# Patient Record
Sex: Female | Born: 1990 | Race: White | Hispanic: No | Marital: Single | Smoking: Never smoker
Health system: Southern US, Community
[De-identification: ages and names within clinical notes are randomized; demographics above are authoritative.]

## PROBLEM LIST (undated history)

## (undated) HISTORY — PX: EXTERNAL EAR SURGERY: SHX627

---
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
## 2012-07-19 ENCOUNTER — Emergency Department (INDEPENDENT_AMBULATORY_CARE_PROVIDER_SITE_OTHER)
Admission: EM | Admit: 2012-07-19 | Discharge: 2012-07-19 | Disposition: A | Discharge status: Home / Self Care | Payer: Managed Care, Other (non HMO) | Source: Home / Self Care | Attending: Family Medicine | Admitting: Family Medicine

## 2012-07-19 ENCOUNTER — Ambulatory Visit (INDEPENDENT_AMBULATORY_CARE_PROVIDER_SITE_OTHER): Payer: Managed Care, Other (non HMO) | Admitting: Sports Medicine

## 2012-07-19 ENCOUNTER — Encounter: Payer: Self-pay | Admitting: *Deleted

## 2012-07-19 DIAGNOSIS — M25529 Pain in unspecified elbow: Secondary | ICD-10-CM

## 2012-07-19 DIAGNOSIS — M25522 Pain in left elbow: Secondary | ICD-10-CM

## 2012-07-19 DIAGNOSIS — G5600 Carpal tunnel syndrome, unspecified upper limb: Secondary | ICD-10-CM

## 2012-07-19 DIAGNOSIS — G5611 Other lesions of median nerve, right upper limb: Secondary | ICD-10-CM

## 2012-07-19 DIAGNOSIS — G5610 Other lesions of median nerve, unspecified upper limb: Secondary | ICD-10-CM | POA: Insufficient documentation

## 2012-07-19 MED ORDER — MELOXICAM 15 MG PO TABS: ORAL_TABLET | ORAL | Status: DC

## 2012-07-19 NOTE — Assessment & Plan Note (Signed)
We will start conservatively. I strapped the elbow. Home exercises. Mobic. Icing. Return in to 3 weeks.

## 2012-07-19 NOTE — Progress Notes (Signed)
  Subjective:    I'm seeing this patient as a consultation for:  Dr. Doree Albee  CC: Elbow pain  HPI: This is pleasant 22 year-old woman who presents with right elbow pain for 3 days. She describes the pain as dull that reaches 6/10 in severity. She can identify no inciting trauma. Her job does involve repetitive motions at the elbow. The pain does not radiate, and she denies any numbness or tingling. She has taken ibuprofen with minimal relief. She does report a recent sore throat which has resolved.  Family history significant for autoimmune disease: thyroid disease in mother and celiac disease in twin.  Past medical history, Surgical history, Family history not pertinant except as noted below, Social history, Allergies, and medications have been entered into the medical record, reviewed, and no changes needed.   Review of Systems: No headache, visual changes, nausea, vomiting, diarrhea, constipation, dizziness, abdominal pain, skin rash, fevers, chills, night sweats, weight loss, swollen lymph nodes, body aches, joint swelling, muscle aches, chest pain, shortness of breath, mood changes, visual or auditory hallucinations.   Objective:   General: Well Developed, well nourished, and in no acute distress.  Neuro/Psych: Alert and oriented x3, extra-ocular muscles intact, able to move all 4 extremities, sensation grossly intact. Skin: Warm and dry, no rashes noted.  Respiratory: Not using accessory muscles, speaking in full sentences, trachea midline.  Cardiovascular: Pulses palpable, no extremity edema. Abdomen: Does not appear distended. Right Elbow: Unremarkable to inspection. Range of motion full pronation, supination, flexion, extension. Strength is full to all of the above directions. Pain was reproducible with resisted pronation. Stable to varus, valgus stress. Palpation reveals tender area in the anterior cubital fossa corresponding to the pronator teres proximally.  Impression  and Recommendations:   This case required medical decision making of moderate complexity.

## 2012-07-19 NOTE — ED Provider Notes (Signed)
History     CSN: [id number]  Arrival date & time 07/19/12  1256   First MD Initiated Contact with Patient 07/19/12 1300      Chief Complaint  Patient presents with  . Mass    right inner arm   HPI  R elbow pain x 2-3 days.  Initially felt like she had a lump along inner elbow.  Pt works doing a lot of repetitive movements at work.  Feels that pain may be attributed to this.  No distal numbness or paresthesias.  Grip strength also decreased 2/2 pain.    History reviewed. No pertinent past medical history.  Past Surgical History  Procedure Laterality Date  . External ear surgery      Family History  Problem Relation Age of Onset  . Hypertension Mother     History  Substance Use Topics  . Smoking status: Never Smoker   . Smokeless tobacco: Never Used  . Alcohol Use: No    OB History   Grav Para Term Preterm Abortions TAB SAB Ect Mult Living                  Review of Systems  All other systems reviewed and are negative.    Allergies  Cefuroxime axetil  Home Medications   Current Outpatient Rx  Name  Route  Sig  Dispense  Refill  . meloxicam (MOBIC) 15 MG tablet      One tab PO qAM with breakfast for 2 weeks, then daily prn pain.   30 tablet   3     BP 119/76  Pulse 77  Temp(Src) 98 F (36.7 C) (Oral)  Resp 14  Ht 5\' 4"  (1.626 m)  Wt 155 lb (70.308 kg)  BMI 26.59 kg/m2  SpO2 100%  LMP 07/12/2012  Physical Exam  Constitutional: She appears well-developed and well-nourished.  HENT:  Head: Normocephalic.  Eyes: Conjunctivae are normal. Pupils are equal, round, and reactive to light.  Neck: Normal range of motion.  Cardiovascular: Normal rate and regular rhythm.   Pulmonary/Chest: Effort normal.  Abdominal: Soft.  Musculoskeletal:       Arms: Neurological: She is alert.  Skin: Skin is warm.    ED Course  Procedures (including critical care time)  Labs Reviewed - No data to display No results found.   1. Elbow pain, left        MDM  Ddx include medial epicondylitis, bursitis, tendinitis.  Will formally consult sports medicine as they are available.   Treatment plan and follow up per sports medicine recs.     The patient and/or caregiver has been counseled thoroughly with regard to treatment plan and/or medications prescribed including dosage, schedule, interactions, rationale for use, and possible side effects and they verbalize understanding. Diagnoses and expected course of recovery discussed and will return if not improved as expected or if the condition worsens. Patient and/or caregiver verbalized understanding.             Doree Albee, MD 08/02/12 [phone]

## 2012-07-19 NOTE — ED Notes (Signed)
Parveen c/o right inner arm lump x 3 days. It became tender 2 days ago. No known cause/injury. Denies pain or tingling radiating down her arm.

## 2013-03-20 ENCOUNTER — Emergency Department (INDEPENDENT_AMBULATORY_CARE_PROVIDER_SITE_OTHER)
Admission: EM | Admit: 2013-03-20 | Discharge: 2013-03-20 | Disposition: A | Discharge status: Home / Self Care | Payer: Managed Care, Other (non HMO) | Source: Home / Self Care

## 2013-03-20 ENCOUNTER — Encounter: Payer: Self-pay | Admitting: Emergency Medicine

## 2013-03-20 DIAGNOSIS — Z23 Encounter for immunization: Secondary | ICD-10-CM

## 2013-03-20 DIAGNOSIS — Z0000 Encounter for general adult medical examination without abnormal findings: Secondary | ICD-10-CM

## 2013-03-20 DIAGNOSIS — Z203 Contact with and (suspected) exposure to rabies: Secondary | ICD-10-CM

## 2013-03-20 MED ORDER — RABIES VACCINE, PCEC IM SUSR
1.0000 mL | Freq: Once | INTRAMUSCULAR | Status: AC
  Administered 2013-03-20: 1 mL via INTRAMUSCULAR

## 2013-03-20 NOTE — ED Notes (Signed)
Nandini is here for prophylactic rabies vaccines for school. 1st shot in 3 shot series.

## 2013-03-27 ENCOUNTER — Emergency Department (INDEPENDENT_AMBULATORY_CARE_PROVIDER_SITE_OTHER)
Admission: EM | Admit: 2013-03-27 | Discharge: 2013-03-27 | Disposition: A | Discharge status: Home / Self Care | Payer: Managed Care, Other (non HMO) | Source: Home / Self Care

## 2013-03-27 ENCOUNTER — Encounter: Payer: Self-pay | Admitting: Emergency Medicine

## 2013-03-27 DIAGNOSIS — Z203 Contact with and (suspected) exposure to rabies: Secondary | ICD-10-CM

## 2013-03-27 DIAGNOSIS — Z23 Encounter for immunization: Secondary | ICD-10-CM

## 2013-03-27 MED ORDER — RABIES VACCINE, PCEC IM SUSR
1.0000 mL | Freq: Once | INTRAMUSCULAR | Status: AC
  Administered 2013-03-27: 1 mL via INTRAMUSCULAR

## 2013-03-27 NOTE — ED Notes (Signed)
Desires number 2 of 3 injection Rabies series. Had some local redness and soreness at site of first injection last week.

## 2013-04-10 ENCOUNTER — Emergency Department
Admission: EM | Admit: 2013-04-10 | Discharge: 2013-04-10 | Disposition: A | Discharge status: Home / Self Care | Payer: Managed Care, Other (non HMO) | Source: Home / Self Care

## 2013-04-10 ENCOUNTER — Encounter: Payer: Self-pay | Admitting: Emergency Medicine

## 2013-04-10 MED ORDER — RABIES VACCINE, PCEC IM SUSR
1.0000 mL | Freq: Once | INTRAMUSCULAR | Status: AC
  Administered 2013-04-10: 1 mL via INTRAMUSCULAR

## 2013-04-10 NOTE — ED Notes (Signed)
The pt is here today for her 3rd preexposure Rabies vaccination.

## 2013-08-29 ENCOUNTER — Encounter: Payer: Self-pay | Admitting: Emergency Medicine

## 2013-08-29 ENCOUNTER — Emergency Department (INDEPENDENT_AMBULATORY_CARE_PROVIDER_SITE_OTHER)
Admission: EM | Admit: 2013-08-29 | Discharge: 2013-08-29 | Disposition: A | Discharge status: Home / Self Care | Payer: Managed Care, Other (non HMO) | Source: Home / Self Care | Attending: Emergency Medicine | Admitting: Emergency Medicine

## 2013-08-29 DIAGNOSIS — A088 Other specified intestinal infections: Secondary | ICD-10-CM

## 2013-08-29 DIAGNOSIS — A084 Viral intestinal infection, unspecified: Secondary | ICD-10-CM

## 2013-08-29 MED ORDER — ONDANSETRON 4 MG PO TBDP: 4.0000 mg | ORAL_TABLET | Freq: Four times a day (QID) | ORAL | Status: DC | PRN

## 2013-08-29 NOTE — ED Notes (Signed)
Danielle Flynn c/o sudden onset vomiting and diarrhea with abdominal cramping since yesterday @ 4pm. She denies eating anything unusual, no one else is sick.

## 2013-08-29 NOTE — ED Provider Notes (Signed)
CSN: [id number]     Arrival date & time 08/29/13  1119 History   First MD Initiated Contact with Patient 08/29/13 1158     Chief Complaint  Patient presents with  . Emesis  . Diarrhea   (Consider location/radiation/quality/duration/timing/severity/associated sxs/prior Treatment) HPI This patient presents with vomiting and diarrhea with some abdominal cramping since about 4:30 yesterday afternoon.  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  She denies eating anything abnormal.  No blood in her diarrhea or her vomit.  She has thrown up about 8 times since yesterday afternoon and had had 2 episodes of diarrhea.  She is currently nauseated.  She is not sexually active there is no way that she could be pregnant.  She has no urinary symptoms.  No upper respiratory symptoms.  Abdominal cramping is located epigastric and comes and goes.  She has not taken any medicines yet.  She has not been able to eat or drink much recently secondary to nausea.   History reviewed. No pertinent past medical history. Past Surgical History  Procedure Laterality Date  . External ear surgery     Family History  Problem Relation Age of Onset  . Hypertension Mother    History  Substance Use Topics  . Smoking status: Never Smoker   . Smokeless tobacco: Never Used  . Alcohol Use: No   OB History   Grav Para Term Preterm Abortions TAB SAB Ect Mult Living                 Review of Systems  All other systems reviewed and are negative.   Allergies  Cefuroxime axetil  Home Medications   Prior to Admission medications   Medication Sig Start Date End Date Taking? Authorizing Provider  meloxicam (MOBIC) 15 MG tablet One tab PO qAM with breakfast for 2 weeks, then daily prn pain. 07/19/12   Monica Bectonhomas J Thekkekandam, MD  ondansetron (ZOFRAN ODT) 4 MG disintegrating tablet Take 1 tablet (4 mg total) by mouth every 6 (six) hours as  needed for nausea or vomiting. 08/29/13   Marlaine HindJeffrey H Jasmaine Rochel, MD   BP 112/74  Pulse 100  Temp(Src) 98.2 F (36.8 C) (Oral)  Resp 14  Wt 154 lb (69.854 kg)  SpO2 100%  LMP 08/05/2013 Physical Exam  Nursing note and vitals reviewed. Constitutional: She is oriented to person, place, and time. Vital signs are normal. She appears well-developed and well-nourished.  Non-toxic appearance. She does not appear ill. She appears distressed (Mildly nauseated).  HENT:  Head: Normocephalic and atraumatic.  Right Ear: Tympanic membrane, external ear and ear canal normal.  Left Ear: Tympanic membrane, external ear and ear canal normal.  Nose: Nose normal.  Mouth/Throat: Uvula is midline and oropharynx is clear and moist.  Eyes: No scleral icterus.  Neck: Neck supple.  Cardiovascular: Regular rhythm and normal heart sounds.   Pulmonary/Chest: Effort normal and breath sounds normal. No respiratory distress. She has no decreased breath sounds. She has no wheezes. She has no rhonchi.  Abdominal: Soft. Normal appearance. There is tenderness (Mild) in the epigastric area. There is no rigidity, no rebound, no guarding, no CVA tenderness, no tenderness at McBurney's point and negative Murphy's sign.  Neurological: She is alert and oriented to person, place, and time.  Skin: Skin is warm and dry.  Psychiatric: She has a normal mood and affect. Her speech is normal  and behavior is normal.    ED Course  Procedures (including critical care time) Labs Review Labs Reviewed - No data to display  Imaging Review No results found.   MDM   1. Viral gastroenteritis    This patient likely with a viral gastroenteritis.  No red flags concerning food poisoning or other bacterial cause.  Will give Zofran ODT tablets for nausea control.  BRAT diet is discussed and encouraged.  Hydration is encouraged as well.  School note given for 2 days since she is likely mildly contagious.  Good hand hygiene and cleaning  encouraged.  If not improving in the next 1-2 days or not able to keep down fluids, may need IV fluids at that time.  However I do not believe that she needs this today as her vital signs are fairly good other than borderline tachycardia.  If she returns, than likely doing some labs including a UA and a CBC would be appropriate at that time as well as 1L NS.  Marlaine HindJeffrey H Bethanee Redondo, MD 08/29/13 [phone]

## 2015-03-25 ENCOUNTER — Ambulatory Visit (INDEPENDENT_AMBULATORY_CARE_PROVIDER_SITE_OTHER): Payer: Managed Care, Other (non HMO)

## 2015-03-25 ENCOUNTER — Ambulatory Visit (INDEPENDENT_AMBULATORY_CARE_PROVIDER_SITE_OTHER): Payer: Managed Care, Other (non HMO) | Admitting: Family Medicine

## 2015-03-25 ENCOUNTER — Encounter: Payer: Self-pay | Admitting: Family Medicine

## 2015-03-25 VITALS — BP 131/73 | HR 71 | Ht 64.0 in | Wt 167.0 lb

## 2015-03-25 DIAGNOSIS — M25552 Pain in left hip: Secondary | ICD-10-CM

## 2015-03-25 DIAGNOSIS — Z111 Encounter for screening for respiratory tuberculosis: Secondary | ICD-10-CM

## 2015-03-25 DIAGNOSIS — Z203 Contact with and (suspected) exposure to rabies: Secondary | ICD-10-CM | POA: Diagnosis not present

## 2015-03-25 DIAGNOSIS — M7062 Trochanteric bursitis, left hip: Secondary | ICD-10-CM

## 2015-03-25 NOTE — Patient Instructions (Signed)
Thank you for coming in today. Return in 1 month.  Attend physical therapy.  Get xray today.   Trochanteric Bursitis You have hip pain due to trochanteric bursitis. Bursitis means that the sack near the outside of the hip is filled with fluid and inflamed. This sack is made up of protective soft tissue. The pain from trochanteric bursitis can be severe and keep you from sleep. It can radiate to the buttocks or down the outside of the thigh to the knee. The pain is almost always worse when rising from the seated or lying position and with walking. Pain can improve after you take a few steps. It happens more often in people with hip joint and lumbar spine problems, such as arthritis or previous surgery. Very rarely the trochanteric bursa can become infected, and antibiotics and/or surgery may be needed. Treatment often includes an injection of local anesthetic mixed with cortisone medicine. This medicine is injected into the area where it is most tender over the hip. Repeat injections may be necessary if the response to treatment is slow. You can apply ice packs over the tender area for 30 minutes every 2 hours for the next few days. Anti-inflammatory and/or narcotic pain medicine may also be helpful. Limit your activity for the next few days if the pain continues. See your caregiver in 5-10 days if you are not greatly improved.  SEEK IMMEDIATE MEDICAL CARE IF:  You develop severe pain, fever, or increased redness.  You have pain that radiates below the knee. EXERCISES STRETCHING EXERCISES - Trochanteric Bursitis  These exercises may help you when beginning to rehabilitate your injury. Your symptoms may resolve with or without further involvement from your physician, physical therapist, or athletic trainer. While completing these exercises, remember:   Restoring tissue flexibility helps normal motion to return to the joints. This allows healthier, less painful movement and activity.  An effective  stretch should be held for at least 30 seconds.  A stretch should never be painful. You should only feel a gentle lengthening or release in the stretched tissue. STRETCH - Iliotibial Band  On the floor or bed, lie on your side so your injured leg is on top. Bend your knee and grab your ankle.  Slowly bring your knee back so that your thigh is in line with your trunk. Keep your heel at your buttocks and gently arch your back so your head, shoulders and hips line up.  Slowly lower your leg so that your knee approaches the floor/bed until you feel a gentle stretch on the outside of your thigh. If you do not feel a stretch and your knee will not fall farther, place the heel of your opposite foot on top of your knee and pull your thigh down farther.  Hold this stretch for __________ seconds.  Repeat __________ times. Complete this exercise __________ times per day. STRETCH - Hamstrings, Supine   Lie on your back. Loop a belt or towel over the ball of your foot as shown.  Straighten your knee and slowly pull on the belt to raise your injured leg. Do not allow the knee to bend. Keep your opposite leg flat on the floor.  Raise the leg until you feel a gentle stretch behind your knee or thigh. Hold this position for __________ seconds.  Repeat __________ times. Complete this stretch __________ times per day. STRETCH - Quadriceps, Prone   Lie on your stomach on a firm surface, such as a bed or padded floor.  Pepco HoldingsBend  your knee and grasp your ankle. If you are unable to reach your ankle or pant leg, use a belt around your foot to lengthen your reach.  Gently pull your heel toward your buttocks. Your knee should not slide out to the side. You should feel a stretch in the front of your thigh and/or knee.  Hold this position for __________ seconds.  Repeat __________ times. Complete this stretch __________ times per day. STRETCHING - Hip Flexors, Lunge Half kneel with your knee on the floor and your  opposite knee bent and directly over your ankle.  Keep good posture with your head over your shoulders. Tighten your buttocks to point your tailbone downward; this will prevent your back from arching too much.  You should feel a gentle stretch in the front of your thigh and/or hip. If you do not feel any resistance, slightly slide your opposite foot forward and then slowly lunge forward so your knee once again lines up over your ankle. Be sure your tailbone remains pointed downward.  Hold this stretch for __________ seconds.  Repeat __________ times. Complete this stretch __________ times per day. STRETCH - Adductors, Lunge  While standing, spread your legs.  Lean away from your injured leg by bending your opposite knee. You may rest your hands on your thigh for balance.  You should feel a stretch in your inner thigh. Hold for __________ seconds.  Repeat __________ times. Complete this exercise __________ times per day.   This information is not intended to replace advice given to you by your health care provider. Make sure you discuss any questions you have with your health care provider.   Document Released: 05/26/2004 Document Revised: 09/02/2014 Document Reviewed: 07/31/2008 Elsevier Interactive Patient Education [employer].

## 2015-03-25 NOTE — Progress Notes (Signed)
Danielle Flynn is a 24 y.o. female [facility]  [facility] for left hip pain and establish care.  1) left hip pain: 6 months ago patient was thrown from a horse landing on her left side. Initial evaluation in the emergency department in [place] show no fractures. She continues to have pain and swelling especially when she lays on her left side. She denies any significant radiating pain weakness or numbness fevers or chills. She has tried some over-the-counter pain medicines which helped only a little. She feels well otherwise  2) PPD and rabies titer. Patient works as a Fish farm managerveterinary technician. She needs a PPD placed and rabies titer for work.   History reviewed. No pertinent past medical history. Past Surgical History  Procedure Laterality Date  . External ear surgery     Social History  Substance Use Topics  . Smoking status: Never Smoker   . Smokeless tobacco: Never Used  . Alcohol Use: No   family history includes Hypertension in her mother.  ROS as above Medications: No current outpatient prescriptions on file.   No current facility-administered medications for this visit.   Allergies  Allergen Reactions  . Cefuroxime Axetil      Exam:  BP 131/73 mmHg  Pulse 71  Ht 5\' 4"  (1.626 m)  Wt 167 lb (75.751 kg)  BMI 28.65 kg/m2 Gen: Well NAD HEENT: EOMI,  MMM Lungs: Normal work of breathing. CTABL Heart: RRR no MRG Abd: NABS, Soft. Nondistended, Nontender Exts: Brisk capillary refill, warm and well perfused.  Left hip: Normal-appearing. Tender palpation left lateral greater trochanter. Normal hip range of motion. Back has normal range of motion is mildly tender in the left SI joint. Lower history strength is intact. Normal gait. Psych: Alert and oriented normal speech thought process and affect.  Limited diagnostic ultrasound of the left lateral hip: No significant fluid accumulation is visible. Bony structures are  normal.   No results found for this or any previous visit (from the past 24 hour(s)). No results found.   Please see individual assessment and plan sections.

## 2015-03-25 NOTE — Progress Notes (Signed)
Quick Note:  Normal, no changes. ______ 

## 2015-03-25 NOTE — Assessment & Plan Note (Signed)
Discussed options. Patient declines injection at this time. Plan for physical therapy. X-ray pending. Return in one month

## 2015-03-25 NOTE — Assessment & Plan Note (Signed)
For work Investment banker, corporatebtain rabies titer. PPD placed.

## 2015-03-27 ENCOUNTER — Emergency Department
Admission: EM | Admit: 2015-03-27 | Discharge: 2015-03-27 | Disposition: A | Discharge status: Home / Self Care | Payer: Managed Care, Other (non HMO) | Source: Home / Self Care

## 2015-03-27 NOTE — ED Notes (Signed)
Pt being seen for a PPD reading from 03/25/15 placement.  Neg 0MM L arm

## 2015-04-01 ENCOUNTER — Encounter: Payer: Self-pay | Admitting: Rehabilitative and Restorative Service Providers"

## 2015-04-01 ENCOUNTER — Ambulatory Visit (INDEPENDENT_AMBULATORY_CARE_PROVIDER_SITE_OTHER): Payer: Managed Care, Other (non HMO) | Admitting: Rehabilitative and Restorative Service Providers"

## 2015-04-01 ENCOUNTER — Encounter: Payer: Self-pay | Admitting: Family Medicine

## 2015-04-01 DIAGNOSIS — M7072 Other bursitis of hip, left hip: Secondary | ICD-10-CM | POA: Diagnosis not present

## 2015-04-01 DIAGNOSIS — M623 Immobility syndrome (paraplegic): Secondary | ICD-10-CM

## 2015-04-01 DIAGNOSIS — Z7409 Other reduced mobility: Secondary | ICD-10-CM

## 2015-04-01 DIAGNOSIS — R209 Unspecified disturbances of skin sensation: Secondary | ICD-10-CM | POA: Diagnosis not present

## 2015-04-01 DIAGNOSIS — M2560 Stiffness of unspecified joint, not elsewhere classified: Secondary | ICD-10-CM

## 2015-04-01 NOTE — Patient Instructions (Addendum)
Abdominal Bracing With Pelvic Floor (Hook-Lying)    With neutral spine, tighten pelvic floor and abdominals, sucking belly button to back bone; tighten muscles in back at waist. Hold 10-20 sec  Repeat _10__ times. Do several___ times a day. Progress to do this sitting; standing; walking and doing functional activities.   HIP: Hamstrings - Supine    Place strap around foot. Raise leg up, keep knee straight. Hold __30_ seconds. __3_ reps per set, __2-3_ times per day   Outer Hip Stretch: Reclined IT Band Stretch (Strap)    Strap around opposite foot, pull across only as far as possible with shoulders on mat. Hold for __30 sec Repeat _3___ times each leg.   Piriformis Stretch    Lying on back, pull right knee toward opposite shoulder. Hold _30___ seconds. Repeat __3__ times. Do __2-3__ sessions per day.    Trunk: Prone Extension (Press-Ups)    Lie on stomach on firm, flat surface. Relax bottom and legs. Raise chest in air with elbows straight. Keep hips flat on surface, sag stomach. Hold _2-3___ seconds. Repeat _10___ times. Do _2-3___ sessions per day. CAUTION: Movement should be gentle and slow.

## 2015-04-01 NOTE — Therapy (Signed)
[facility] [address] [place] [address] [place], [place], [address] Phone: [phone]   Fax:  [phone]  Physical Therapy Evaluation  Patient Details  Name: Danielle Flynn MRN: [id number] Date of Birth: [birth year] Referring Provider: Dr. Clementeen GrahamEvan Corey  Encounter Date: 04/01/2015      PT End of Session - 04/01/15 1159    Visit Number 1   Number of Visits 12   Date for PT Re-Evaluation 05/12/05   PT Start Time 1109   PT Stop Time 1159   PT Time Calculation (min) 50 min   Activity Tolerance Patient tolerated treatment well      History reviewed. No pertinent past medical history.  Past Surgical History  Procedure Laterality Date  . External ear surgery      There were no vitals filed for this visit.  Visit Diagnosis:  Hip bursitis, left - Plan: PT plan of care cert/re-cert  Abnormal sensation of lower extremity - Plan: PT plan of care cert/re-cert  Stiffness due to immobility - Plan: PT plan of care cert/re-cert      Subjective Assessment - 04/01/15 1111    Subjective Danielle Flynn reports that she was thrown from horse landing on back and Lt LE 06/16. She was treated with medication and rest with some improvement. She has continued intermittent pain in the Lt hip but constant decreased sensation in Lt hip area.    Pertinent History Denies any other injuries/medical problems    How long can you sit comfortably? no limit   How long can you stand comfortably? no limit   How long can you walk comfortably? no limit   Diagnostic tests xrays (-); US (-)   Patient Stated Goals get rid decreased sensation in the Lt hip - to feel the hip again    Currently in Pain? No/denies            [facility] - 04/01/15 0001    Assessment   Medical Diagnosis Lt greater tranchanteric bursitis   Referring Provider Dr. Clementeen GrahamEvan Corey   Onset Date/Surgical Date 10/16/14   Hand Dominance Right   Next MD Visit 04/22/15   Prior Therapy none   Precautions   Precautions None   Balance Screen   Has the patient fallen in the past 6 months Yes   How many times? 1   Has the patient had a decrease in activity level because of a fear of falling?  No   Is the patient reluctant to leave their home because of a fear of falling?  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; bending; reaching   Leisure riding horses in the summer; household chores   Observation/Other Assessments   Focus on Therapeutic Outcomes (FOTO)  23% limitation   Sensation   Additional Comments numbness to light touch [facility] ~ 6 inch oval shape present since time of fall    Posture/Postural Control   Posture Comments head fwd; shd rounded; ant pelvic tilt; knees hyperextended    AROM   Right/Left Hip --  WFL's bilat hips   Lumbar Flexion 80%   Lumbar Extension 60%   Lumbar - Right Side Bend 80%   Lumbar - Left Side Bend 85%   Lumbar - Right Rotation 45%   Lumbar - Left Rotation 40%   Strength   Overall Strength Comments 5/5 bilat LE's    Flexibility  Hamstrings Rt 77 deg; Lt 68 deg   Quadriceps WFL's bilat   ITB WFL's    Piriformis tight Lt pifirofmis and diagonal knee to chest    Palpation   Spinal mobility tender and tight with UPA mobs Lt L4/5; L5/S1    Palpation comment tender Lt lumbosacral spine and Lt gluts/hip abductors                    [facility] Adult PT Treatment/Exercise - 04/01/15 0001    Lumbar Exercises: Stretches   Passive Hamstring Stretch 3 reps;30 seconds   Press Ups --  2 sec x 10    ITB Stretch 3 reps;30 seconds   Piriformis Stretch 3 reps;30 seconds   Lumbar Exercises: Supine   AB Set Limitations 3 part core 10 sec x 10   Cryotherapy   Number Minutes Cryotherapy 15 Minutes   Cryotherapy Location Lumbar Spine;Hip  Lt L-spine; Lt hip in area of numbness   Type of Cryotherapy Ice pack   Electrical Stimulation    Electrical Stimulation Location Lt L-spine; Lt hip in area of numbness    Electrical Stimulation Action IFC   Electrical Stimulation Parameters to tolerance    Electrical Stimulation Goals Other (comment)  sensory nerve stimulation                 PT Education - 04/01/15 1139    Education Details HEP; TENS unit   Person(s) Educated Patient   Methods Explanation;Demonstration;Tactile cues;Verbal cues;Handout   Comprehension Verbalized understanding;Returned demonstration;Verbal cues required;Tactile cues required             PT Long Term Goals - 04/01/15 1213    PT LONG TERM GOAL #1   Title Improve spinal mobility/ROM with decreased tightness with UPA mobs Lt lumbar spine 05/13/15   Time 6   Period Weeks   Status New   PT LONG TERM GOAL #2   Title Improve core stability with patient I in HEP 05/13/15   Time 6   Period Weeks   Status New   PT LONG TERM GOAL #3   Title Instruct in desentization activites for area of abnomal sensation Lt hip 05/13/15   Time 6   Period Weeks   Status New   PT LONG TERM GOAL #4   Title Improve FOTO to </= 14% limitation 05/13/15   Time 6   Period Weeks   Status New               Plan - 04/01/15 1159    Clinical Impression Statement Danielle Flynn presents with c/o persistent, constant numbness Lt lateral proximal hip and intermitent Lt LB pain following a fal from a horse 06/16. She has tightness and tenderness through the lower lumbar spine on Lt; muscular tightness in Lt hip area; decreesad sensation to light pressure Lt lateral hip. Pt will benefit form PT to address problems identified.    Pt will benefit from skilled therapeutic intervention in order to improve on the following deficits Impaired sensation;Decreased mobility;Decreased range of motion;Increased fascial restricitons   Rehab Potential Good   PT Frequency 2x / week   PT Duration 6 weeks   PT Treatment/Interventions Patient/family education;ADLs/Self Care Home  Management;Therapeutic exercise;Therapeutic activities;Manual techniques;Dry needling;Electrical Stimulation;Cryotherapy;Moist Heat;Ultrasound;Neuromuscular re-education   PT Next Visit Plan Progress with core stabilization; trial of manual work Lt lumbar and hip area; spinal mobs Lumbar spine; myofacial ball release work Lt hip area   PT Home Exercise Plan HEP; info on TENS unit; education  in desentization activities.   Consulted and Agree with Plan of Care Patient         Problem List Patient Active Problem List   Diagnosis Date Noted  . Greater trochanteric bursitis of left hip 03/25/2015  . 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

## 2015-04-06 ENCOUNTER — Ambulatory Visit (INDEPENDENT_AMBULATORY_CARE_PROVIDER_SITE_OTHER): Payer: Managed Care, Other (non HMO) | Admitting: Rehabilitative and Restorative Service Providers"

## 2015-04-06 ENCOUNTER — Encounter: Payer: Self-pay | Admitting: Rehabilitative and Restorative Service Providers"

## 2015-04-06 DIAGNOSIS — R209 Unspecified disturbances of skin sensation: Secondary | ICD-10-CM | POA: Diagnosis not present

## 2015-04-06 DIAGNOSIS — Z7409 Other reduced mobility: Secondary | ICD-10-CM

## 2015-04-06 DIAGNOSIS — M2560 Stiffness of unspecified joint, not elsewhere classified: Secondary | ICD-10-CM

## 2015-04-06 DIAGNOSIS — M7072 Other bursitis of hip, left hip: Secondary | ICD-10-CM | POA: Diagnosis not present

## 2015-04-06 DIAGNOSIS — M623 Immobility syndrome (paraplegic): Secondary | ICD-10-CM | POA: Diagnosis not present

## 2015-04-06 NOTE — Patient Instructions (Signed)
Strengthening: Hip Extension - Resisted    With tubing around right ankle, face anchor and pull leg straight back. Repeat __10__ times per set. Do __2-3__ sets per session. Do _2___ sessions per day.   Strengthening: Hip Abduction - Resisted    With tubing around right leg, other side toward anchor, extend leg out from side. Repeat _10___ times per set. Do _2-3___ sets per session. Do __2__ sessions per day.

## 2015-04-06 NOTE — Therapy (Signed)
[facility] [address] [place] [address] [place], [place], [address] Phone: [phone]   Fax:  [phone]  Physical Therapy Treatment  Patient Details  Name: Danielle Flynn MRN: [id number] Date of Birth: 1990-06-17 Referring Provider: Dr. Clementeen GrahamEvan Corey  Encounter Date: 04/06/2015      PT End of Session - 04/06/15 0738    Visit Number 2   Number of Visits 12   Date for PT Re-Evaluation 05/12/05   PT Start Time 0737   PT Stop Time 0811   PT Time Calculation (min) 34 min   Activity Tolerance Patient tolerated treatment well      History reviewed. No pertinent past medical history.  Past Surgical History  Procedure Laterality Date  . External ear surgery      There were no vitals filed for this visit.  Visit Diagnosis:  Hip bursitis, left  Abnormal sensation of lower extremity  Stiffness due to immobility      Subjective Assessment - 04/06/15 0738    Subjective Danielle Flynn reports that she can feel a little more in the hip than she thought. Doing exercises and desentization at home.    Currently in Pain? No/denies                         [facility] Treatment/Exercise - 04/06/15 0001    Lumbar Exercises: Stretches   Passive Hamstring Stretch 3 reps;30 seconds   Press Ups --  2 sec x 10    ITB Stretch 3 reps;30 seconds   Piriformis Stretch 3 reps;30 seconds   Lumbar Exercises: Aerobic   Stationary Bike Nustep L4 x 4 min    Lumbar Exercises: Standing   Other Standing Lumbar Exercises hip ext x20; hip abd leading with heel x 20    Lumbar Exercises: Supine   AB Set Limitations 3 part core 10 sec x 10   Moist Heat Therapy   Number Minutes Moist Heat 15 Minutes   Electrical Stimulation   Electrical Stimulation Location Lt L-spine; Lt hip in area of numbness    Electrical Stimulation Action IFC   Electrical Stimulation Parameters to tolerance   Electrical Stimulation Goals Other (comment)  sensory nerve  stimulation    Manual Therapy   Manual therapy comments Pt Rt sidelying    Soft tissue mobilization Lt lateral hip    Myofascial Release Lt lateral hip                 PT Education - 04/06/15 0757    Education provided Yes   Education Details HEP   Person(s) Educated Patient   Methods Explanation;Demonstration;Tactile cues;Verbal cues;Handout   Comprehension Verbalized understanding;Returned demonstration;Verbal cues required;Tactile cues required             PT Long Term Goals - 04/01/15 1213    PT LONG TERM GOAL #1   Title Improve spinal mobility/ROM with decreased tightness with UPA mobs Lt lumbar spine 05/13/15   Time 6   Period Weeks   Status New   PT LONG TERM GOAL #2   Title Improve core stability with patient I in HEP 05/13/15   Time 6   Period Weeks   Status New   PT LONG TERM GOAL #3   Title Instruct in desentization activites for area of abnomal sensation Lt hip 05/13/15   Time 6   Period Weeks   Status New   PT LONG TERM GOAL #4   Title Improve FOTO to </=  14% limitation 05/13/15   Time 6   Period Weeks   Status New               Plan - 04/06/15 0759    Clinical Impression Statement Possible change in sensation since initial treatment. 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 No goals accomplished - only 2nd visit. Changes may be gradual and slow due to nerve involvement.    Pt will benefit from skilled therapeutic intervention in order to improve on the following deficits Impaired sensation;Decreased mobility;Decreased range of motion;Increased fascial restricitons   Rehab Potential Good   PT Frequency 2x / week   PT Duration 6 weeks   PT Treatment/Interventions Patient/family education;ADLs/Self Care Home Management;Therapeutic exercise;Therapeutic activities;Manual techniques;Dry needling;Electrical Stimulation;Cryotherapy;Moist Heat;Ultrasound;Neuromuscular re-education   PT Next Visit Plan Progress with core  stabilization; trial of manual work Lt lumbar and hip area; spinal mobs Lumbar spine; myofacial ball release work Lt hip area   PT Home Exercise Plan HEP; info on TENS unit; education in desentization activities.   Consulted and Agree with Plan of Care Patient        Problem List Patient Active Problem List   Diagnosis Date Noted  . Greater trochanteric bursitis of left hip 03/25/2015  . 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 [place], [place], [address] Phone: [phone]   Fax:  [phone]  Name: Danielle Flynn MRN: [id number] Date of Birth: [birth year]

## 2015-04-08 ENCOUNTER — Ambulatory Visit (INDEPENDENT_AMBULATORY_CARE_PROVIDER_SITE_OTHER): Payer: Managed Care, Other (non HMO) | Admitting: Rehabilitative and Restorative Service Providers"

## 2015-04-08 ENCOUNTER — Encounter: Payer: Self-pay | Admitting: Rehabilitative and Restorative Service Providers"

## 2015-04-08 DIAGNOSIS — R209 Unspecified disturbances of skin sensation: Secondary | ICD-10-CM

## 2015-04-08 DIAGNOSIS — M7072 Other bursitis of hip, left hip: Secondary | ICD-10-CM

## 2015-04-08 DIAGNOSIS — M623 Immobility syndrome (paraplegic): Secondary | ICD-10-CM

## 2015-04-08 DIAGNOSIS — M2560 Stiffness of unspecified joint, not elsewhere classified: Secondary | ICD-10-CM

## 2015-04-08 DIAGNOSIS — Z7409 Other reduced mobility: Secondary | ICD-10-CM

## 2015-04-08 NOTE — Therapy (Signed)
[facility] [place] [address] [place] [address] [place], [place], [address] Phone: [phone]   Fax:  [phone]  Physical Therapy Treatment  Patient Details  Name: Danielle Flynn MRN: [id number] Date of Birth: 04/03/1991 Referring Provider: Dr. Clementeen Graham  Encounter Date: 04/08/2015      PT End of Session - 04/08/15 0946    Visit Number 3   Number of Visits 12   Date for PT Re-Evaluation 05/12/05   PT Start Time 0940   PT Stop Time 1030   PT Time Calculation (min) 50 min   Activity Tolerance Patient tolerated treatment well      History reviewed. No pertinent past medical history.  Past Surgical History  Procedure Laterality Date  . External ear surgery      There were no vitals filed for this visit.  Visit Diagnosis:  Hip bursitis, left  Abnormal sensation of lower extremity  Stiffness due to immobility      Subjective Assessment - 04/08/15 0943    Subjective Can feel area on lateral hip more - less numb feeling    Currently in Pain? No/denies                         [facility] Adult PT Treatment/Exercise - 04/08/15 0001    Therapeutic Activites    Therapeutic Activities --  myofacial ball release work Lt hip    Lumbar Exercises: Community education officer 3 reps;30 seconds   Press Ups --  2 sec x 10    ITB Stretch 3 reps;30 seconds   Piriformis Stretch 3 reps;30 seconds   Lumbar Exercises: Aerobic   Stationary Bike Nustep L5 x 4 min    Lumbar Exercises: Standing   Wall Slides --  10sec x 10    Other Standing Lumbar Exercises hip ext x20; hip abd leading with heel x 20; hip abduction/ext leading with heel x20    Other Standing Lumbar Exercises Lt hip abd with TB resistance red x 10    Lumbar Exercises: Supine   AB Set Limitations 3 part core 10 sec x 10   Lumbar Exercises: Prone   Other Prone Lumbar Exercises pelvic press 10 sec x 10   Other Prone Lumbar Exercises alt hip ext with pelvic  press x10 each    Moist Heat Therapy   Number Minutes Moist Heat 15 Minutes   Moist Heat Location Lumbar Spine   Electrical Stimulation   Electrical Stimulation Location Lt L-spine; Lt hip in area of numbness    Electrical Stimulation Action IFC   Electrical Stimulation Parameters to tolerance    Electrical Stimulation Goals Other (comment)  sensory nerve stimulation    Manual Therapy   Manual therapy comments Pt Rt sidelying    Soft tissue mobilization Lt lateral hip    Myofascial Release Lt lateral hip                 PT Education - 04/08/15 1007    Education provided Yes   Education Details myofacial release; HEP   Person(s) Educated Patient   Methods Explanation;Demonstration;Tactile cues;Verbal cues;Handout   Comprehension Verbalized understanding;Returned demonstration;Verbal cues required;Tactile cues required             PT Long Term Goals - 04/01/15 1213    PT LONG TERM GOAL #1   Title Improve spinal mobility/ROM with decreased tightness with UPA mobs Lt lumbar spine 05/13/15   Time 6   Period Weeks  Status New   PT LONG TERM GOAL #2   Title Improve core stability with patient I in HEP 05/13/15   Time 6   Period Weeks   Status New   PT LONG TERM GOAL #3   Title Instruct in desentization activites for area of abnomal sensation Lt hip 05/13/15   Time 6   Period Weeks   Status New   PT LONG TERM GOAL #4   Title Improve FOTO to </= 14% limitation 05/13/15   Time 6   Period Weeks   Status New               Plan - 04/08/15 0946    Clinical Impression Statement Progressing well with rehab with positive changes in hip sensation.    Pt will benefit from skilled therapeutic intervention in order to improve on the following deficits Impaired sensation;Decreased mobility;Decreased range of motion;Increased fascial restricitons   Rehab Potential Good   PT Frequency 2x / week   PT Duration 6 weeks   PT Treatment/Interventions Patient/family  education;ADLs/Self Care Home Management;Therapeutic exercise;Therapeutic activities;Manual techniques;Dry needling;Electrical Stimulation;Cryotherapy;Moist Heat;Ultrasound;Neuromuscular re-education   PT Next Visit Plan Progress with core stabilization; trial of manual work Lt lumbar and hip area; spinal mobs Lumbar spine   PT Home Exercise Plan HEP; info on TENS unit; education in desentization activities.   Consulted and Agree with Plan of Care Patient        Problem List Patient Active Problem List   Diagnosis Date Noted  . Greater trochanteric bursitis of left hip 03/25/2015  . 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

## 2015-04-08 NOTE — Patient Instructions (Signed)
Ball massage [place] on [place] - fake one at target   Pelvic Press    Place hands under belly between navel and pubic bone, palms up. Feel pressure on hands. Increase pressure on hands by pressing pelvis down.  Hold _10__ seconds. Relax. Repeat _10__ times.   Leg Lift: One-Leg    Press pelvis down. Keep knee straight; lengthen and lift one leg (from waist). Do not twist body. Keep other leg down. Hold __1-2_ seconds. Relax. Repeat 10 time alternating legs.

## 2015-04-13 ENCOUNTER — Ambulatory Visit (INDEPENDENT_AMBULATORY_CARE_PROVIDER_SITE_OTHER): Payer: Managed Care, Other (non HMO) | Admitting: Rehabilitative and Restorative Service Providers"

## 2015-04-13 DIAGNOSIS — M2560 Stiffness of unspecified joint, not elsewhere classified: Secondary | ICD-10-CM

## 2015-04-13 DIAGNOSIS — M623 Immobility syndrome (paraplegic): Secondary | ICD-10-CM | POA: Diagnosis not present

## 2015-04-13 DIAGNOSIS — Z7409 Other reduced mobility: Secondary | ICD-10-CM

## 2015-04-13 DIAGNOSIS — R209 Unspecified disturbances of skin sensation: Secondary | ICD-10-CM | POA: Diagnosis not present

## 2015-04-13 DIAGNOSIS — M7072 Other bursitis of hip, left hip: Secondary | ICD-10-CM | POA: Diagnosis not present

## 2015-04-13 NOTE — Patient Instructions (Signed)
Abduction: Clam (Eccentric) - Side-Lying    Lie on side with knees bent. Lift top knee, keeping feet together. Keep trunk steady. Slowly lower 10 reps per set, _2-3 sets

## 2015-04-13 NOTE — Therapy (Signed)
[facility] [address] [place] [address] [place], [place], [address] Phone: [phone]   Fax:  [phone]  Physical Therapy Treatment  Patient Details  Name: Danielle Flynn MRN: [id number] Date of Birth: [birth year] Referring Provider: Denyse Amassorey  Encounter Date: 04/13/2015      PT End of Session - 04/13/15 0816    Visit Number 4   Number of Visits 12   Date for PT Re-Evaluation 05/12/05   PT Start Time 0732   PT Stop Time 0822   PT Time Calculation (min) 50 min   Activity Tolerance Patient tolerated treatment well      No past medical history on file.  Past Surgical History  Procedure Laterality Date  . External ear surgery      There were no vitals filed for this visit.  Visit Diagnosis:  Hip bursitis, left  Abnormal sensation of lower extremity  Stiffness due to immobility      Subjective Assessment - 04/13/15 0746    Subjective Feeling area on lateral hip more now. Also has noticed some increase in pain in the Lt hip up higher/ Has had a sharp pain in this area maybe once a week but it is now a little more frequent. Shew has felt pain 2-3 times in the past week. Pain lasts 5 min .   Currently in Pain? No/denies            [facility] PT Assessment - 04/13/15 0001    Assessment   Medical Diagnosis Lt greater tranchanteric bursitis   Referring Provider Denyse Amassorey   Onset Date/Surgical Date 10/16/14   Hand Dominance Right   Next MD Visit 04/22/15   Prior Therapy none   Sensation   Additional Comments decreasing numbness to light [facility] ~ 6 inch oval shape present since time of fall    AROM   Lumbar Flexion 85%   Lumbar Extension 70%   Flexibility   Hamstrings Rt 84 deg; Lt 79 deg    Palpation   Spinal mobility less tender and tight with UPA mobs Lt L4/5; L5/S1    Palpation comment tender Lt lumbosacral spine and Lt gluts/hip abductors                      [facility] Adult PT  Treatment/Exercise - 04/13/15 0001    Therapeutic Activites    Therapeutic Activities --  myofacial ball release work Lt hip    Lumbar Exercises: Stretches   Passive Hamstring Stretch 3 reps;30 seconds   Standing Side Bend Limitations sitting lateral trunk flexion Rt x2 20 sec; Lt x1 20 sec    Press Ups --  2 sec x 10    ITB Stretch 3 reps;30 seconds   Piriformis Stretch 3 reps;30 seconds   Lumbar Exercises: Aerobic   Stationary Bike Nustep L6 x 4 min    Lumbar Exercises: Sidelying   Clam 20 reps;2 seconds   Lumbar Exercises: Prone   Other Prone Lumbar Exercises pelvic press 10 sec x 10   Other Prone Lumbar Exercises alt hip ext with pelvic press x10 each    Moist Heat Therapy   Number Minutes Moist Heat 15 Minutes   Moist Heat Location Lumbar Spine   Electrical Stimulation   Electrical Stimulation Location Lt L-spine; Lt hip in area of numbness    Electrical Stimulation Action IFC   Electrical Stimulation Parameters to tolerance   Electrical Stimulation Goals Other (comment)  sensory nerve stimulation  Manual Therapy   Manual therapy comments Pt Rt sidelying    Joint Mobilization CPA mobs lumbar spine minimal tightness    Soft tissue mobilization Lt lateral hip    Myofascial Release Lt lateral hip                 PT Education - 04/13/15 0754    Education provided Yes   Education Details HEP   Person(s) Educated Patient   Methods Explanation;Demonstration;Tactile cues;Verbal cues;Handout   Comprehension Verbalized understanding;Returned demonstration;Verbal cues required;Tactile cues required             PT Long Term Goals - 04/13/15 0820    PT LONG TERM GOAL #1   Title Improve spinal mobility/ROM with decreased tightness with UPA mobs Lt lumbar spine 05/13/15   Time 6   Period Weeks   Status On-going   PT LONG TERM GOAL #2   Title Improve core stability with patient I in HEP 05/13/15   Time 6   Period Weeks   Status On-going   PT LONG TERM GOAL #3    Title Instruct in desentization activites for area of abnomal sensation Lt hip 05/13/15   Time 6   Period Weeks   Status Achieved   PT LONG TERM GOAL #4   Title Improve FOTO to </= 14% limitation 05/13/15   Time 6   Period Weeks   Status On-going               Plan - 04/13/15 0817    Clinical Impression Statement Some increase in sharp pain in the proximal lateral Lt hip - now occurs 2-3 x/wk instead of 1x/wk. Note tightness through the iliac crest whick loosened with manual work today. Area of numbness is decreasing in size. Flexibility is improving. Progressing toward stated goals of therapy.    Pt will benefit from skilled therapeutic intervention in order to improve on the following deficits Impaired sensation;Decreased mobility;Decreased range of motion;Increased fascial restricitons   Rehab Potential Good   PT Frequency 2x / week   PT Duration 6 weeks   PT Treatment/Interventions Patient/family education;ADLs/Self Care Home Management;Therapeutic exercise;Therapeutic activities;Manual techniques;Dry needling;Electrical Stimulation;Cryotherapy;Moist Heat;Ultrasound;Neuromuscular re-education   PT Next Visit Plan Progress with core stabilization; continue manual work Lt lumbar and hip area; spinal mobs Lumbar spine   PT Home Exercise Plan HEP   Consulted and Agree with Plan of Care Patient        Problem List Patient Active Problem List   Diagnosis Date Noted  . Greater trochanteric bursitis of left hip 03/25/2015  . 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

## 2015-04-15 ENCOUNTER — Encounter: Payer: Self-pay | Admitting: Rehabilitative and Restorative Service Providers"

## 2015-04-15 ENCOUNTER — Ambulatory Visit (INDEPENDENT_AMBULATORY_CARE_PROVIDER_SITE_OTHER): Payer: Managed Care, Other (non HMO) | Admitting: Rehabilitative and Restorative Service Providers"

## 2015-04-15 DIAGNOSIS — R209 Unspecified disturbances of skin sensation: Secondary | ICD-10-CM | POA: Diagnosis not present

## 2015-04-15 DIAGNOSIS — M7072 Other bursitis of hip, left hip: Secondary | ICD-10-CM | POA: Diagnosis not present

## 2015-04-15 DIAGNOSIS — M2560 Stiffness of unspecified joint, not elsewhere classified: Secondary | ICD-10-CM

## 2015-04-15 DIAGNOSIS — M623 Immobility syndrome (paraplegic): Secondary | ICD-10-CM | POA: Diagnosis not present

## 2015-04-15 DIAGNOSIS — Z7409 Other reduced mobility: Secondary | ICD-10-CM

## 2015-04-15 NOTE — Patient Instructions (Signed)
Strengthening: Hip Abduction (Side-Lying)    Tighten muscles on left hip and thigh, keep hip back and lead up with heel, lift leg __10-12__ inches from surface, keeping knee locked.  Repeat _10___ times per set. Do _1-3___ sets per session. Do _1-2___ sessions per day.    Stretching: Hamstring (Standing)    Place right foot on stool. Slowly lean forward, keeping back straight, until stretch is felt in back of thigh. Hold __30__ seconds. Repeat __3__ times per set. Do ___1-2_ sets per session. Do __1-2__ sessions per day.

## 2015-04-15 NOTE — Therapy (Signed)
[facility] [address] [place], [place], [address] Phone: [phone]   Fax:  [phone]  Physical Therapy Treatment  Patient Details  Name: Danielle Flynn MRN: [id number] Date of Birth: [birth year] Referring Provider: Denyse Amassorey  Encounter Date: 04/15/2015      PT End of Session - 04/15/15 0904    Visit Number 5   Number of Visits 12   Date for PT Re-Evaluation 05/12/05   PT Start Time 0847   PT Stop Time 0930   PT Time Calculation (min) 43 min   Activity Tolerance Patient tolerated treatment well      History reviewed. No pertinent past medical history.  Past Surgical History  Procedure Laterality Date  . External ear surgery      There were no vitals filed for this visit.  Visit Diagnosis:  Hip bursitis, left  Abnormal sensation of lower extremity  Stiffness due to immobility      Subjective Assessment - 04/15/15 0903    Subjective No sharp pains in hip since last visit. Numb area in lateral hi pcontinues to improve.    Currently in Pain? No/denies                         [facility] Adult PT Treatment/Exercise - 04/15/15 0001    Lumbar Exercises: Stretches   Passive Hamstring Stretch 3 reps;30 seconds  repeated in standing and seated.    Standing Side Bend Limitations sitting lateral trunk flexion Rt x2 20 sec; Lt x1 20 sec    Press Ups --  2 sec x 10    ITB Stretch 3 reps;30 seconds   Piriformis Stretch 3 reps;30 seconds   Lumbar Exercises: Aerobic   Stationary Bike Nustep L6 x 4 min    Lumbar Exercises: Standing   Other Standing Lumbar Exercises hip ext x20; hip abd leading with heel x 20; hip abduction/ext leading with heel x20    Other Standing Lumbar Exercises Lt hip abd with TB resistance red x 10    Lumbar Exercises: Supine   AB Set Limitations 3 part core 10 sec x 10   Lumbar Exercises: Sidelying   Clam 20 reps;2 seconds   Hip Abduction 10 reps;1 second  leading with heel in  slight hip extension    Lumbar Exercises: Prone   Other Prone Lumbar Exercises pelvic press 10 sec hold, x 8 reps; repeated with hip ext (knee straight x 10 reps each leg; press with unilateral knee bend x 5 reps each leg.    Moist Heat Therapy   Number Minutes Moist Heat 15 Minutes   Moist Heat Location Lumbar Spine   Electrical Stimulation   Electrical Stimulation Location Lt L-spine; Lt hip in area of numbness    Electrical Stimulation Action IFC    Electrical Stimulation Parameters to tolerance    Electrical Stimulation Goals Other (comment)  sensory nerve stimulation    Manual Therapy   Manual therapy comments Pt Rt sidelying    Joint Mobilization CPA mobs lumbar spine minimal tightness    Soft tissue mobilization Lt lateral hip    Myofascial Release Lt lateral hip                 PT Education - 04/15/15 0930    Education provided Yes   Education Details HEP   Person(s) Educated Patient   Methods Explanation;Demonstration;Tactile cues;Verbal cues;Handout   Comprehension Verbalized understanding;Returned demonstration;Verbal cues required;Tactile cues required  PT Long Term Goals - 04/13/15 0820    PT LONG TERM GOAL #1   Title Improve spinal mobility/ROM with decreased tightness with UPA mobs Lt lumbar spine 05/13/15   Time 6   Period Weeks   Status On-going   PT LONG TERM GOAL #2   Title Improve core stability with patient I in HEP 05/13/15   Time 6   Period Weeks   Status On-going   PT LONG TERM GOAL #3   Title Instruct in desentization activites for area of abnomal sensation Lt hip 05/13/15   Time 6   Period Weeks   Status Achieved   PT LONG TERM GOAL #4   Title Improve FOTO to </= 14% limitation 05/13/15   Time 6   Period Weeks   Status On-going               Plan - 04/15/15 0930    Clinical Impression Statement Continued improvement in pain and sensation.RTD next week. Pleased with progress. Tolerated new exercises well.    Pt  will benefit from skilled therapeutic intervention in order to improve on the following deficits Impaired sensation;Decreased mobility;Decreased range of motion;Increased fascial restricitons   Rehab Potential Good   PT Frequency 2x / week   PT Duration 6 weeks   PT Treatment/Interventions Patient/family education;ADLs/Self Care Home Management;Therapeutic exercise;Therapeutic activities;Manual techniques;Dry needling;Electrical Stimulation;Cryotherapy;Moist Heat;Ultrasound;Neuromuscular re-education   PT Next Visit Plan Progress with core stabilization; continue manual work Lt lumbar and hip area; spinal mobs Lumbar spine   PT Home Exercise Plan HEP   Consulted and Agree with Plan of Care Patient        Problem List Patient Active Problem List   Diagnosis Date Noted  . Greater trochanteric bursitis of left hip 03/25/2015  . Rabies exposure 03/25/2015    Celyn Rober Minion PT, MPH  04/15/2015, 9:32 AM  [facility] [address] [place], [place], [address] Phone: [phone]   Fax:  [phone]  Name: Danielle Flynn MRN: [id number] Date of Birth: 1991-01-27

## 2015-04-20 ENCOUNTER — Ambulatory Visit (INDEPENDENT_AMBULATORY_CARE_PROVIDER_SITE_OTHER): Payer: Managed Care, Other (non HMO) | Admitting: Rehabilitative and Restorative Service Providers"

## 2015-04-20 ENCOUNTER — Encounter: Payer: Self-pay | Admitting: Rehabilitative and Restorative Service Providers"

## 2015-04-20 DIAGNOSIS — M623 Immobility syndrome (paraplegic): Secondary | ICD-10-CM

## 2015-04-20 DIAGNOSIS — R209 Unspecified disturbances of skin sensation: Secondary | ICD-10-CM

## 2015-04-20 DIAGNOSIS — M7072 Other bursitis of hip, left hip: Secondary | ICD-10-CM

## 2015-04-20 DIAGNOSIS — M2560 Stiffness of unspecified joint, not elsewhere classified: Secondary | ICD-10-CM

## 2015-04-20 DIAGNOSIS — Z7409 Other reduced mobility: Secondary | ICD-10-CM

## 2015-04-20 NOTE — Therapy (Signed)
[place] [facility] [place], [place], [address] Phone: [phone]   Fax:  [phone]  Physical Therapy Treatment  Patient Details  Name: Danielle Flynn MRN: [id number] Date of Birth: [birth year] Referring Provider: Georgina Snell  Encounter Date: 04/20/2015      PT End of Session - 04/20/15 0810    Visit Number 6   Number of Visits 12   Date for PT Re-Evaluation 05/12/05   PT Start Time 0730   PT Stop Time 0816   PT Time Calculation (min) 46 min   Activity Tolerance Patient tolerated treatment well      History reviewed. No pertinent past medical history.  Past Surgical History  Procedure Laterality Date  . External ear surgery      There were no vitals filed for this visit.  Visit Diagnosis:  Hip bursitis, left  Abnormal sensation of lower extremity  Stiffness due to immobility      Subjective Assessment - 04/20/15 0805    Subjective no sharpy pains in Lt hip in over a week. Numbness continues to improve. Confident in continuing with I HEP    Currently in Pain? No/denies            [facility] PT Assessment - 04/20/15 0001    Assessment   Medical Diagnosis Lt greater tranchanteric bursitis   Referring Provider Georgina Snell   Onset Date/Surgical Date 10/16/14   Hand Dominance Right   Next MD Visit 04/22/15   Prior Therapy none   Observation/Other Assessments   Focus on Therapeutic Outcomes (FOTO)  2% limitation    Sensation   Additional Comments decreasing numbness to light touch lateral hip at [facility] area ~ 6 inch oval shape present since time of fall    AROM   Lumbar Flexion 85%   Lumbar Extension 70%   Strength   Overall Strength Comments 5/5 bilat LE's    Flexibility   Hamstrings Rt 85 deg; Lt 80 deg                      [facility] Adult PT Treatment/Exercise - 04/20/15 0001    Lumbar Exercises: Stretches   Passive Hamstring Stretch 3 reps;30 seconds  repeated in standing and seated.     Standing Side Bend Limitations sitting lateral trunk flexion Rt x2 20 sec; Lt x1 20 sec    Press Ups --  2 sec x 10    ITB Stretch 3 reps;30 seconds   Piriformis Stretch 3 reps;30 seconds   Lumbar Exercises: Aerobic   Stationary Bike Nustep L6 x 4 min    Lumbar Exercises: Standing   Other Standing Lumbar Exercises hip ext x20; hip abd leading with heel x 20; hip abduction/ext leading with heel x20    Other Standing Lumbar Exercises Lt hip abd with TB resistance red x 10    Lumbar Exercises: Supine   AB Set Limitations 3 part core 10 sec x 10   Lumbar Exercises: Sidelying   Clam 20 reps;2 seconds   Hip Abduction 10 reps;1 second  leading with heel in slight hip extension    Lumbar Exercises: Prone   Other Prone Lumbar Exercises pelvic press 10 sec hold, x 8 reps; repeated with hip ext (knee straight x 10 reps each leg; press with unilateral knee bend x 5 reps each leg.    Moist Heat Therapy   Number Minutes Moist Heat 15 Minutes   Moist Heat Location Lumbar Spine   Electrical Stimulation  Electrical Stimulation Location Lt L-spine; Lt hip in area of numbness    Electrical Stimulation Action IFC   Electrical Stimulation Parameters to tolerance    Electrical Stimulation Goals Other (comment)  sensory nerve stimulation    Manual Therapy   Manual therapy comments Pt Rt sidelying    Soft tissue mobilization Lt lateral hip    Myofascial Release Lt lateral hip                 PT Education - 04/20/15 0807    Education provided Yes   Education Details HEP; desentization    Person(s) Educated Patient   Methods Explanation   Comprehension Verbalized understanding             PT Long Term Goals - 04/20/15 0829    PT LONG TERM GOAL #1   Title Improve spinal mobility/ROM with decreased tightness with UPA mobs Lt lumbar spine 05/13/15   Time 6   Period Weeks   Status Achieved   PT LONG TERM GOAL #2   Title Improve core stability with patient I in HEP 05/13/15   Time 6    Period Weeks   Status Achieved   PT LONG TERM GOAL #3   Title Instruct in desentization activites for area of abnomal sensation Lt hip 05/13/15   Time 6   Period Weeks   Status Achieved   PT LONG TERM GOAL #4   Title Improve FOTO to </= 14% limitation 05/13/15   Baseline FOTO 2% limitation    Time 6   Period Weeks   Status Achieved               Plan - 04/20/15 9553    Clinical Impression Statement Good progress with therapy. Nerea has no pain in the Lt hip and resolving paresthesia in Lt lateral hip area. She is pleased with her progress and feels confident in continuing with an I HEP.    Pt will benefit from skilled therapeutic intervention in order to improve on the following deficits Impaired sensation;Decreased mobility;Decreased range of motion;Increased fascial restricitons   Rehab Potential Good   PT Frequency 2x / week   PT Duration 6 weeks   PT Treatment/Interventions Patient/family education;ADLs/Self Care Home Management;Therapeutic exercise;Therapeutic activities;Manual techniques;Dry needling;Electrical Stimulation;Cryotherapy;Moist Heat;Ultrasound;Neuromuscular re-education   PT Next Visit Plan D/C to I HEP    PT Home Exercise Plan HEP   Consulted and Agree with Plan of Care Patient        Problem List Patient Active Problem List   Diagnosis Date Noted  . Greater trochanteric bursitis of left hip 03/25/2015  . 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  Patient goals were met. Patient is being discharged due to meeting the stated rehab goals.  ?????    Celyn P. Helene Kelp PT, MPH 04/20/2015 8:31 AM

## 2015-04-22 ENCOUNTER — Encounter: Payer: Self-pay | Admitting: Family Medicine

## 2015-04-22 ENCOUNTER — Ambulatory Visit (INDEPENDENT_AMBULATORY_CARE_PROVIDER_SITE_OTHER): Payer: Managed Care, Other (non HMO) | Admitting: Family Medicine

## 2015-04-22 VITALS — BP 120/62 | HR 80 | Wt 166.0 lb

## 2015-04-22 DIAGNOSIS — M7062 Trochanteric bursitis, left hip: Secondary | ICD-10-CM

## 2015-04-22 LAB — TB SKIN TEST
Induration: 0 mm
TB Skin Test: NEGATIVE

## 2015-04-22 NOTE — Progress Notes (Signed)
       Danielle Flynn is a 24 y.o. female who [name] for follow-up greater trochanteric bursitis of the left hip. Patient was seen in November 23. She received 6 visits of physical therapy and is doing much better. She no longer has pain. She continues to have some mild paresthesias to her lateral hip which is improving. She's been discharged from physical therapy and is now doing solely home exercises. Is great.  Additionally patient notes that we obtained to obtain a rabies titer at the last visit as part of a occupational exposure requirement. She works as a Market researchervet narrative technician. She passed out during phlebotomy and the lab was delayed. She will get the blood drawn in the near future.   No past medical history on file. Past Surgical History  Procedure Laterality Date  . External ear surgery     Social History  Substance Use Topics  . Smoking status: Never Smoker   . Smokeless tobacco: Never Used  . Alcohol Use: No   family history includes Hypertension in her mother.  ROS as above Medications: No current outpatient prescriptions on file.   No current facility-administered medications for this visit.   Allergies  Allergen Reactions  . Cefuroxime Axetil      Exam:  BP 120/62 mmHg  Pulse 80  Wt 166 lb (75.297 kg) Gen: Well NAD   Results for orders placed or performed in visit on 03/25/15 (from the past 24 hour(s))  PPD     Status: None   Collection Time: 04/22/15  9:37 AM  Result Value Ref Range   TB Skin Test Negative    Induration 0 mm   No results found.   Please see individual assessment and plan sections.

## 2015-04-22 NOTE — Patient Instructions (Signed)
Thank you for coming in today. Return as needed.  Get your PAP smear.

## 2015-04-22 NOTE — Assessment & Plan Note (Signed)
Much better. Continue home exercises. Return as needed.

## 2015-05-25 LAB — RABIES VIRUS ANTIBODY TITER

## 2015-05-25 NOTE — Progress Notes (Signed)
Quick Note:  Negative rabies titer. You will likely need a repeat rabies booster shot if you need to work with animals. Please consult your work guidelines. ______

## 2015-07-08 ENCOUNTER — Ambulatory Visit: Payer: Managed Care, Other (non HMO)

## 2015-07-08 ENCOUNTER — Emergency Department (INDEPENDENT_AMBULATORY_CARE_PROVIDER_SITE_OTHER)
Admission: EM | Admit: 2015-07-08 | Discharge: 2015-07-08 | Disposition: A | Discharge status: Home / Self Care | Payer: Managed Care, Other (non HMO) | Source: Home / Self Care

## 2015-07-08 DIAGNOSIS — Z23 Encounter for immunization: Secondary | ICD-10-CM

## 2015-07-08 DIAGNOSIS — Z203 Contact with and (suspected) exposure to rabies: Secondary | ICD-10-CM

## 2015-07-08 MED ORDER — RABIES VACCINE, PCEC IM SUSR
1.0000 mL | Freq: Once | INTRAMUSCULAR | Status: AC
  Administered 2015-07-08: 1 mL via INTRAMUSCULAR

## 2015-07-08 NOTE — ED Notes (Signed)
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 She is in need of a booster.

## 2016-01-26 ENCOUNTER — Encounter: Payer: Self-pay | Admitting: Osteopathic Medicine

## 2016-01-26 ENCOUNTER — Ambulatory Visit (INDEPENDENT_AMBULATORY_CARE_PROVIDER_SITE_OTHER): Payer: Managed Care, Other (non HMO) | Admitting: Osteopathic Medicine

## 2016-01-26 VITALS — BP 136/73 | HR 90 | Ht 64.0 in | Wt 162.0 lb

## 2016-01-26 DIAGNOSIS — N63 Unspecified lump in unspecified breast: Secondary | ICD-10-CM

## 2016-01-26 NOTE — Patient Instructions (Signed)
In women your age, breast masses are typically diagnosed as the following:  Fibroadenoma: 35% - noncancerous tumor Benign Breast Mass: 52% Breast Cyst: 10% Breast Cancer: 3%  Based on your exam, and based on your low risk of cancer given other factors such as family history, age, other medical problems - I am not suspicious of cancer at this time.   However, if this persists or is not going away, is worsening or changing, or is causing any pain, it would be worth working up further with an ultrasound or other imaging. Please let us know if the cyst does not improve over the course of the next month or 2, there is no right away if something is getting worse or changing.

## 2016-01-26 NOTE — Progress Notes (Signed)
HPI: Danielle Flynn is a 25 y.o. female  who [name] today, 01/26/[facility],  for chief complaint of:  Chief Complaint  Patient presents with  . Breast Mass    RIGHT    . Location: Right 12:00 position . Quality: Lump/mass, nontender . Duration: Few weeks . Timing: Not sure if related to menstrual cycle . Assoc signs/symptoms: No rash or skin changes, no nipple discharge.   Past medical, surgical, social and family history reviewed: No past medical history on file. Past Surgical History:  Procedure Laterality Date  . EXTERNAL EAR SURGERY     Social History  Substance Use Topics  . Smoking status: Never Smoker  . Smokeless tobacco: Never Used  . Alcohol use No   Family History  Problem Relation Age of Onset  . Hypertension Mother      Current medication list and allergy/intolerance information reviewed:   No current outpatient prescriptions on file.   No current facility-administered medications for this visit.    Allergies  Allergen Reactions  . Cefuroxime Axetil       Review of Systems:  Constitutional:  No  fever, no chills, No recent illness   Cardiac: No  chest pai   Skin: No  Rash, No other wounds/concerning lesions  Hem/Onc: No  easy bruising/bleeding, No  abnormal lymph node, (+) lump as per HPI   Exam:  BP 136/73   Pulse 90   Ht 5\' 4"  (1.626 m)   Wt 162 lb (73.5 kg)   BMI 27.81 kg/m   Constitutional: VS see above. General Appearance: alert, well-developed, well-nourished, NAD  Neck: No masses, trachea midline.   Respiratory: Normal respiratory effort  Musculoskeletal: no chest wall tenderness   Skin: warm, dry, intact. No rash/ulcer. No concerning nevi or subq nodules on limited exam.    Breast: About pea-sized cystic lesion palpable at 12:00 position of right breast, nontender, no overlying skin changes, no lymphadenopathy, no nipple discharge. Otherwise, fibrocystic breast tissue  normal    ASSESSMENT/PLAN: Low risk for cancer, okay for watchful waiting at this point, see patient instructions. All questions were answered  Breast mass   Patient Instructions  In women your age, breast masses are typically diagnosed as the following:  Fibroadenoma: 35% - noncancerous tumor Benign Breast Mass: 52% Breast Cyst: 10% Breast Cancer: 3%  Based on your exam, and based on your low risk of cancer given other factors such as family history, age, other medical problems - I am not suspicious of cancer at this time.   However, if this persists or is not going away, is worsening or changing, or is causing any pain, it would be worth working up further with an ultrasound or other imaging. Please let us know if the cyst does not improve over the course of the next month or 2, there is no right away if something is getting worse or changing.    Visit summary with medication list and pertinent instructions was printed for patient to review. All questions at time of visit were answered - patient instructed to contact office with any additional concerns. ER/RTC precautions were reviewed with the patient. Follow-up plan: Return if symptoms worsen or fail to improve in 1 - 2 months.

## 2017-09-13 IMAGING — CR DG PELVIS 1-2V
1 series · 1 of 1 positions shown · non-contrast
Comparison: None.

CLINICAL DATA: Left hip pain, left greater trochanter bursitis

EXAM:
PELVIS - 1-2 VIEW

[pelvis ap]
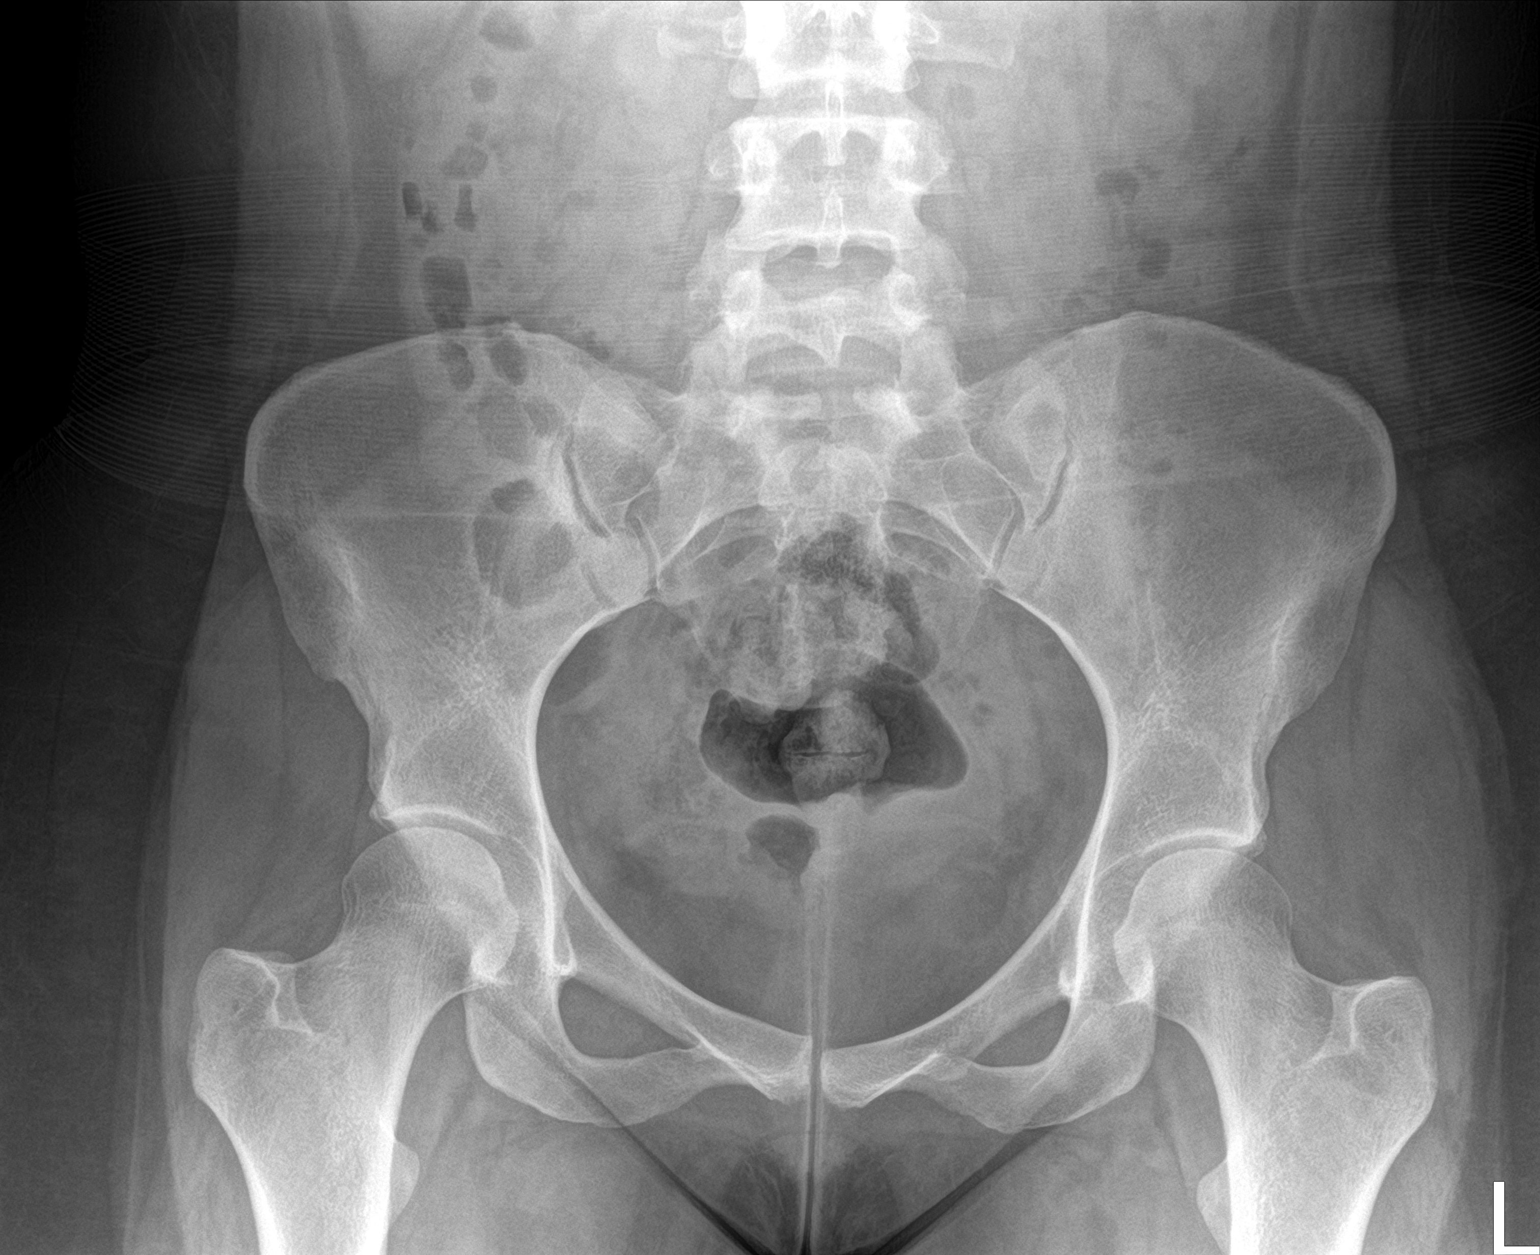

[1 of 1 positions shown; findings below may reference images not displayed]

FINDINGS: There is no evidence of pelvic fracture or diastasis. No pelvic bone
lesions are seen. Bilateral hip joints are symmetrical in
appearance. SI joints are unremarkable.
IMPRESSION: Negative.
